# Patient Record
Sex: Female | Born: 1965 | Race: White | Hispanic: No | Marital: Married | Smoking: Current every day smoker
Health system: Southern US, Community
[De-identification: ages and names within clinical notes are randomized; demographics above are authoritative.]

## PROBLEM LIST (undated history)

## (undated) DIAGNOSIS — M797 Fibromyalgia: Secondary | ICD-10-CM

## (undated) DIAGNOSIS — F329 Major depressive disorder, single episode, unspecified: Secondary | ICD-10-CM

## (undated) DIAGNOSIS — F32A Depression, unspecified: Secondary | ICD-10-CM

## (undated) DIAGNOSIS — IMO0002 Reserved for concepts with insufficient information to code with codable children: Secondary | ICD-10-CM

## (undated) HISTORY — PX: TUBAL LIGATION: SHX77

## (undated) HISTORY — PX: OTHER SURGICAL HISTORY: SHX169

## (undated) HISTORY — PX: CATARACT EXTRACTION, BILATERAL: SHX1313

## (undated) HISTORY — PX: TRIGGER FINGER RELEASE: SHX641

## (undated) HISTORY — PX: CHOLECYSTECTOMY: SHX55

---

## 2004-12-11 ENCOUNTER — Emergency Department (HOSPITAL_COMMUNITY): Admission: EM | Admit: 2004-12-11 | Discharge: 2004-12-11 | Payer: Self-pay | Admitting: Emergency Medicine

## 2005-07-05 IMAGING — CT CT HEAD W/O CM
2 of 3 series · 14 of 33 positions shown, 18 images · IV contrast (agent unspecified)
Comparison: none

CLINICAL DATA: Box fell on top of head.
 CT HEAD WITHOUT CONTRAST:
 There are no midline shifts or mass effects and the ventricles are normal in size and contour.  There is no evidence for intracerebral hemorrhage or contusion and there are no extra-axial fluid collections.  Seen is an air-fluid level within the left maxillary sinus.  Bone window settings are otherwise normal.

[Series 2: — · sagittal · 0.59mm/px · 1 of 3 slices shown (1 of 2)]
[im 2/3  brain]
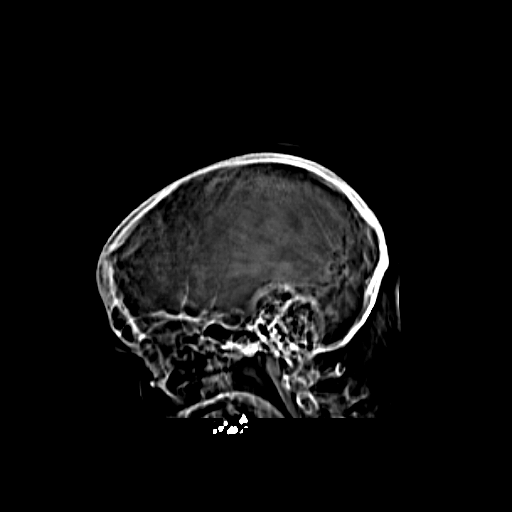

[Series 3: — · axial · 0.43mm/px · z∈[+1202,+1312]mm · 13 of 26 slices shown, 17 images (2 of 2)]
[im 2/26  brain]
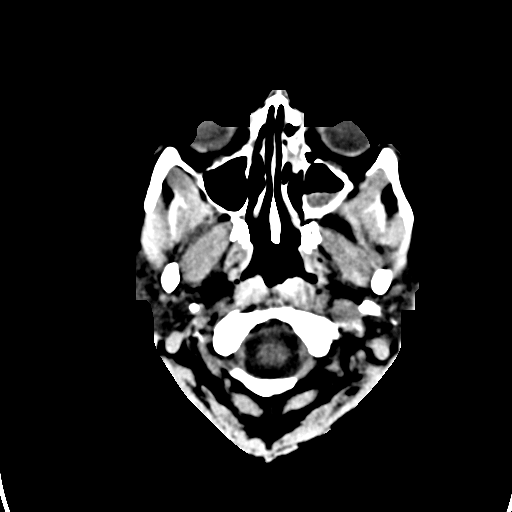
[im 2/26  bone]
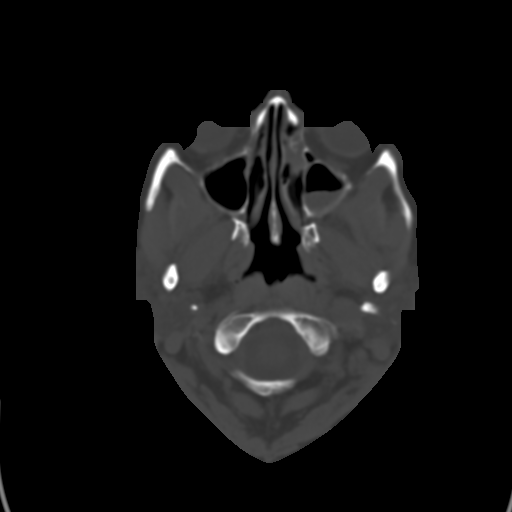
[im 4/26  brain]
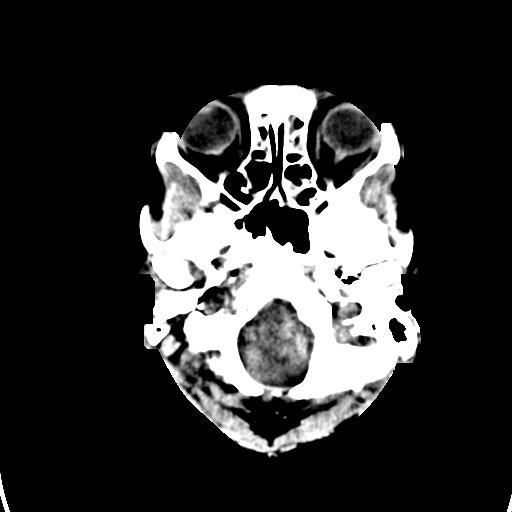
[im 6/26  brain]
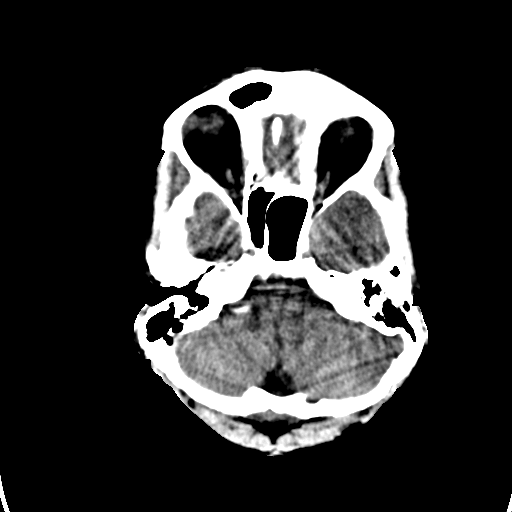
[im 8/26  brain]
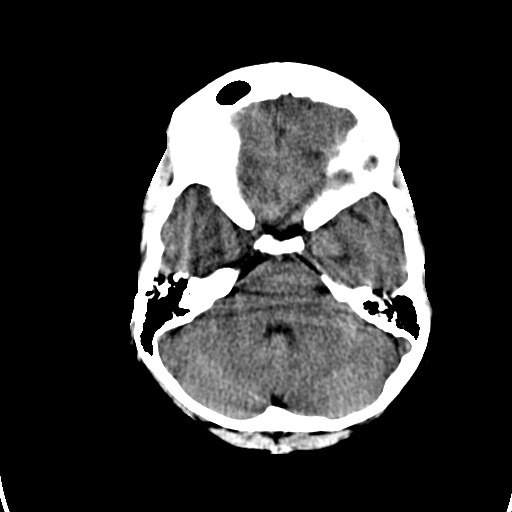
[im 9/26  brain]
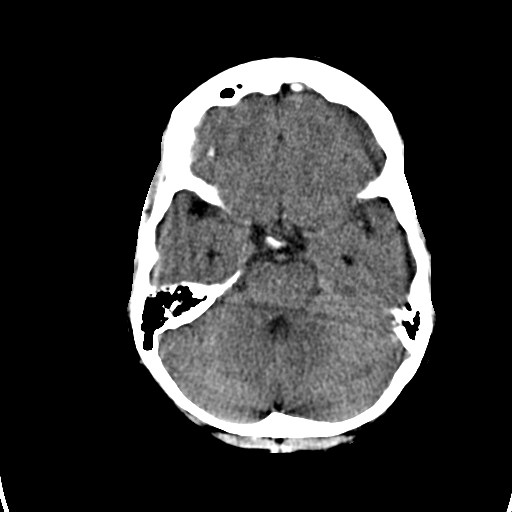
[im 9/26  bone]
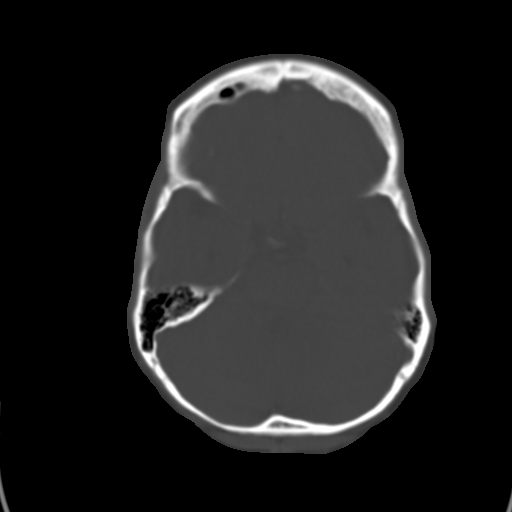
[im 11/26  brain]
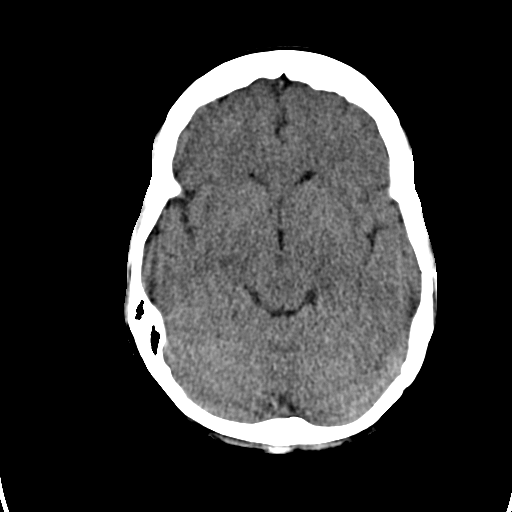
[im 13/26  brain]
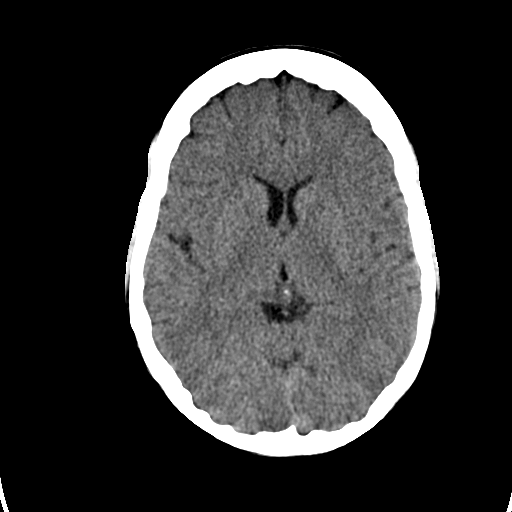
[im 15/26  brain]
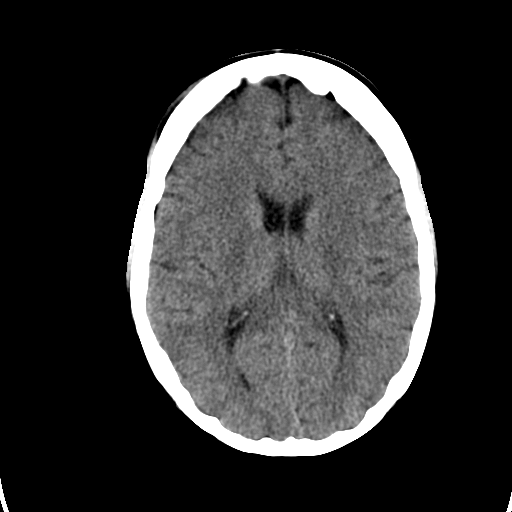
[im 17/26  brain]
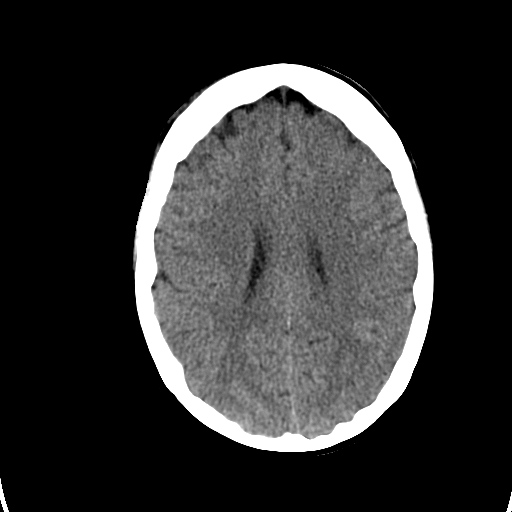
[im 17/26  bone]
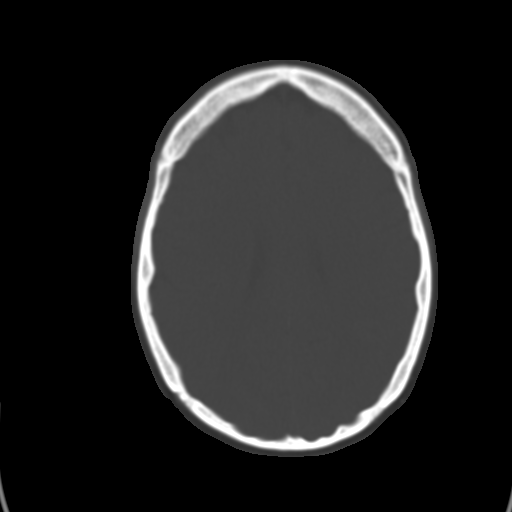
[im 18/26  brain]
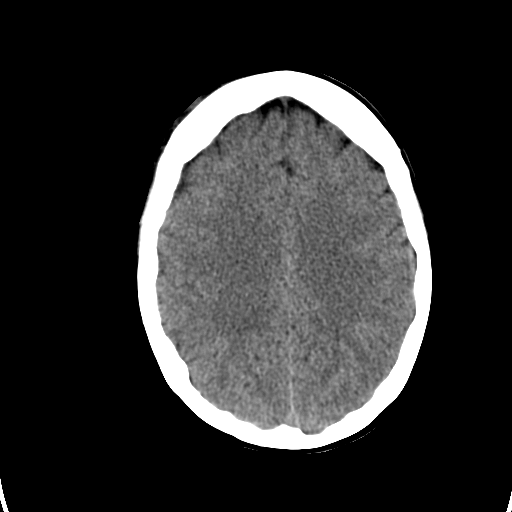
[im 20/26  brain]
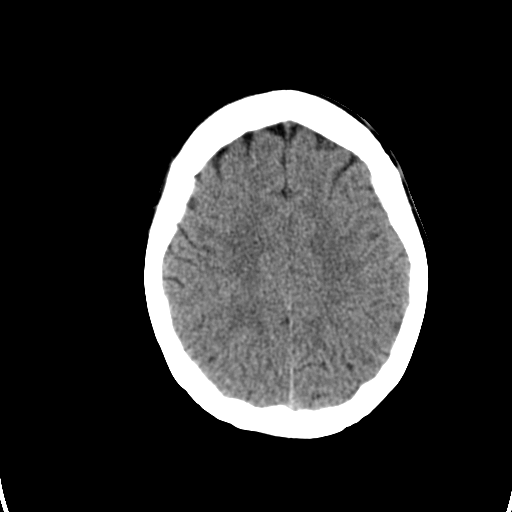
[im 22/26  brain]
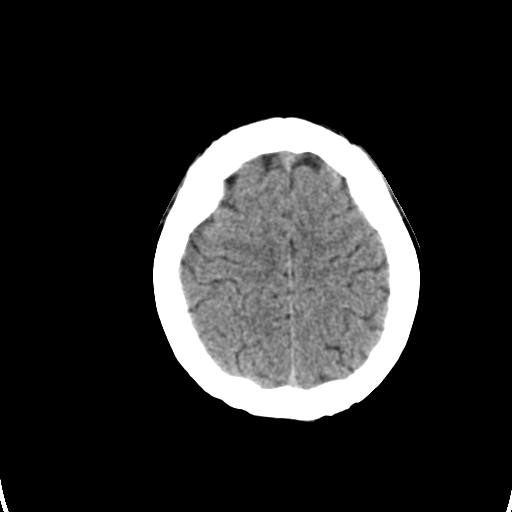
[im 24/26  brain]
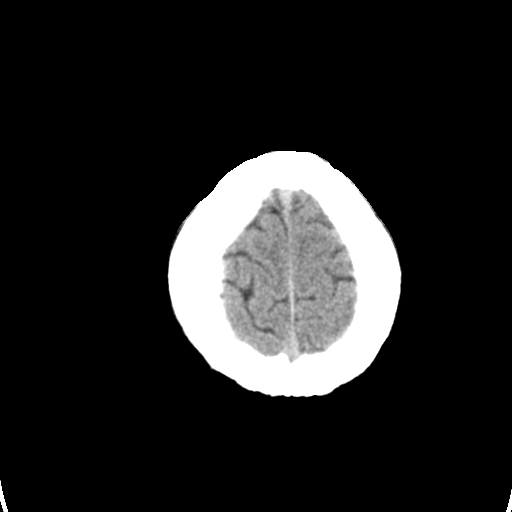
[im 24/26  bone]
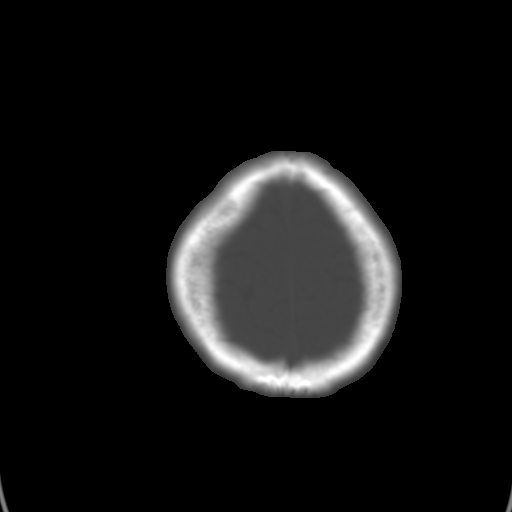

[14 of 33 positions shown; findings below may reference images not displayed]

IMPRESSION: Air-fluid level within the left maxillary sinus.  Normal intracranial study.

## 2005-07-05 IMAGING — CR DG CERVICAL SPINE COMPLETE 4+V
6 series · 6 of 6 positions shown · non-contrast
Comparison: none

CLINICAL DATA: Trauma with injury to neck and back with neck and back pain.
 SIX-VIEW CERVICAL SPINE:
 Normal cervical alignment is noted without evidence of fracture, subluxation, or prevertebral soft tissue swelling.  Disc spaces are well-maintained.

[view not recorded (1 of 6)]
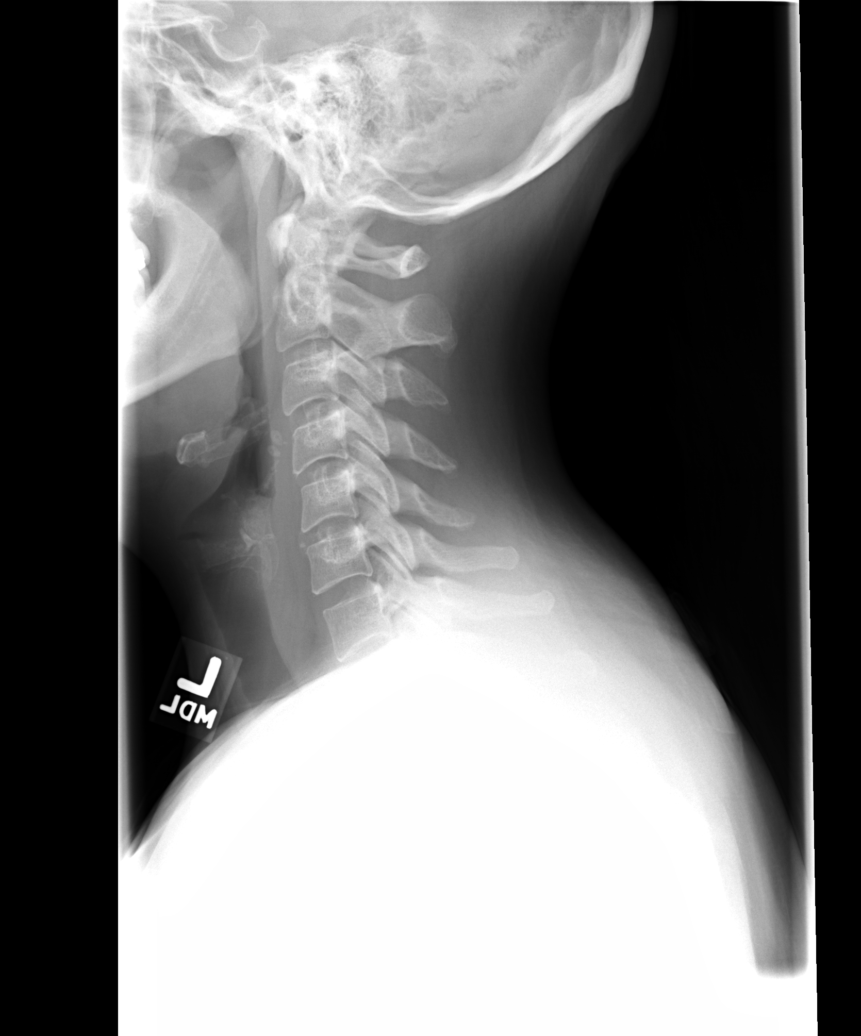

[view not recorded (2 of 6)]
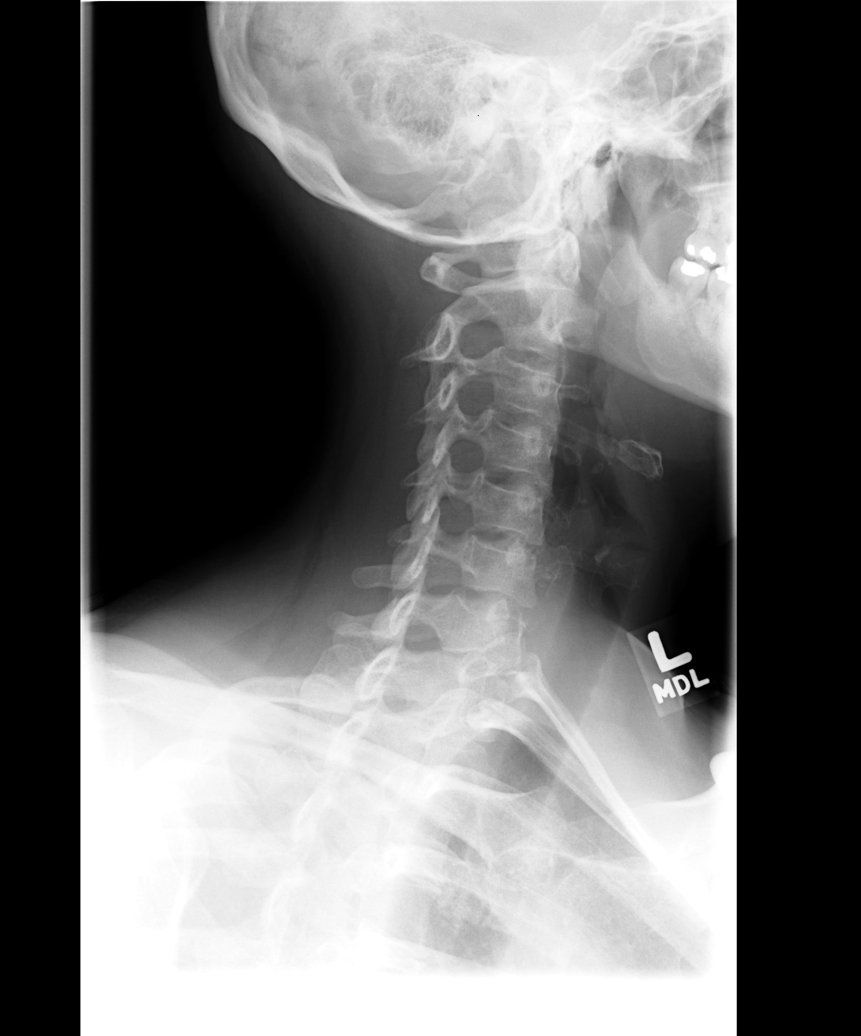

[view not recorded (3 of 6)]
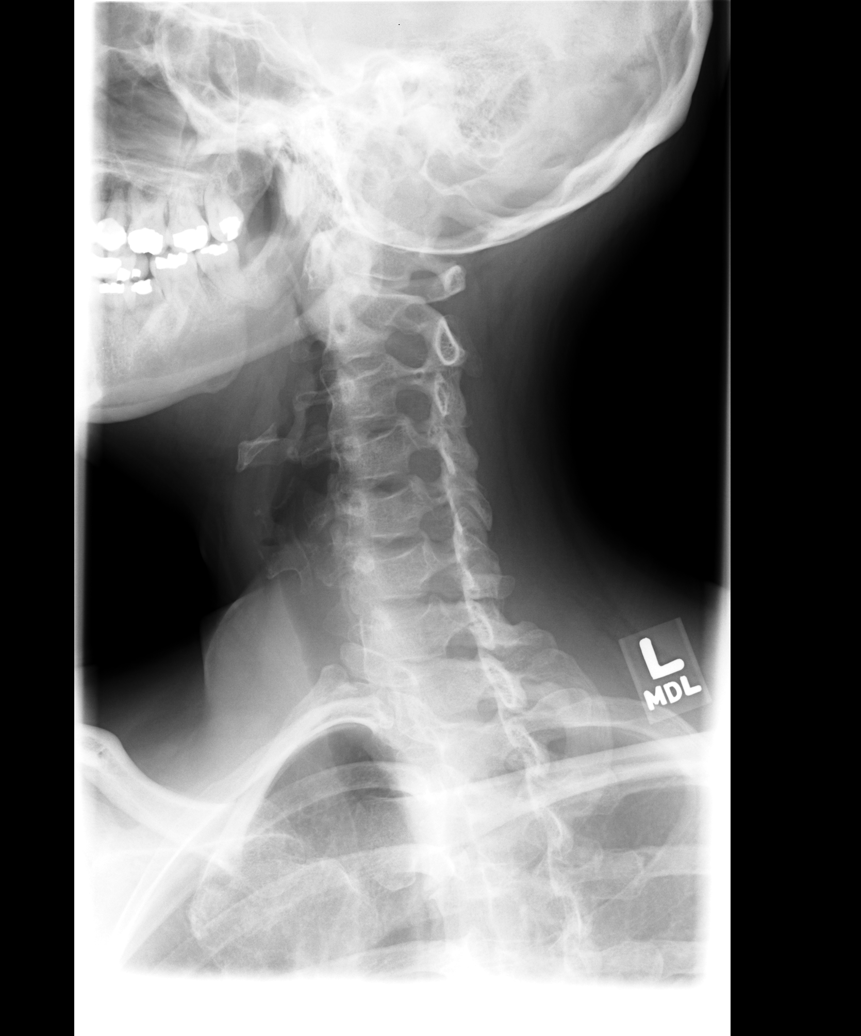

[view not recorded (4 of 6)]
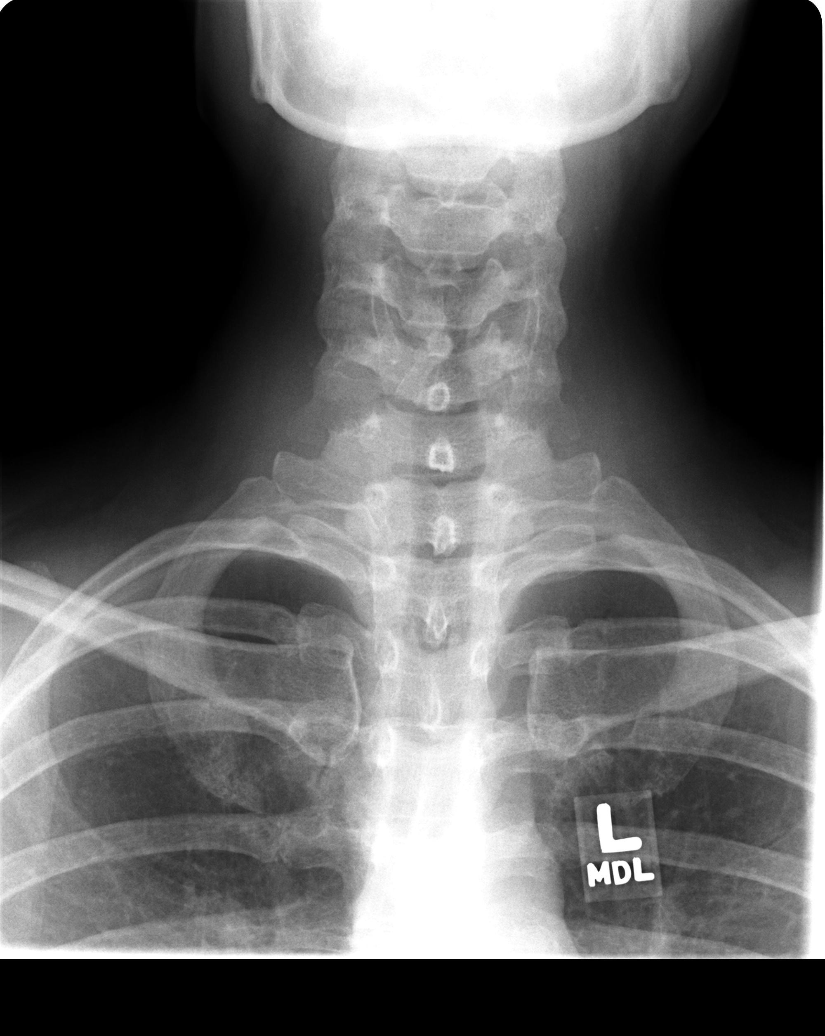

[view not recorded (5 of 6)]
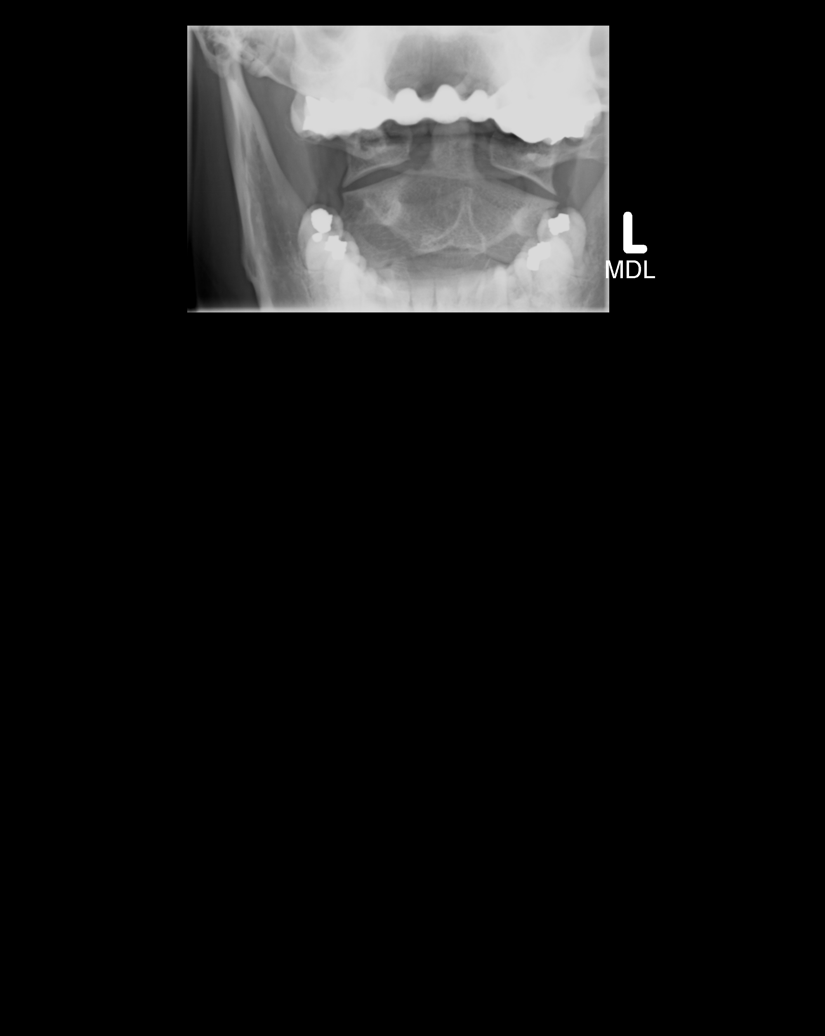

[view not recorded (6 of 6)]
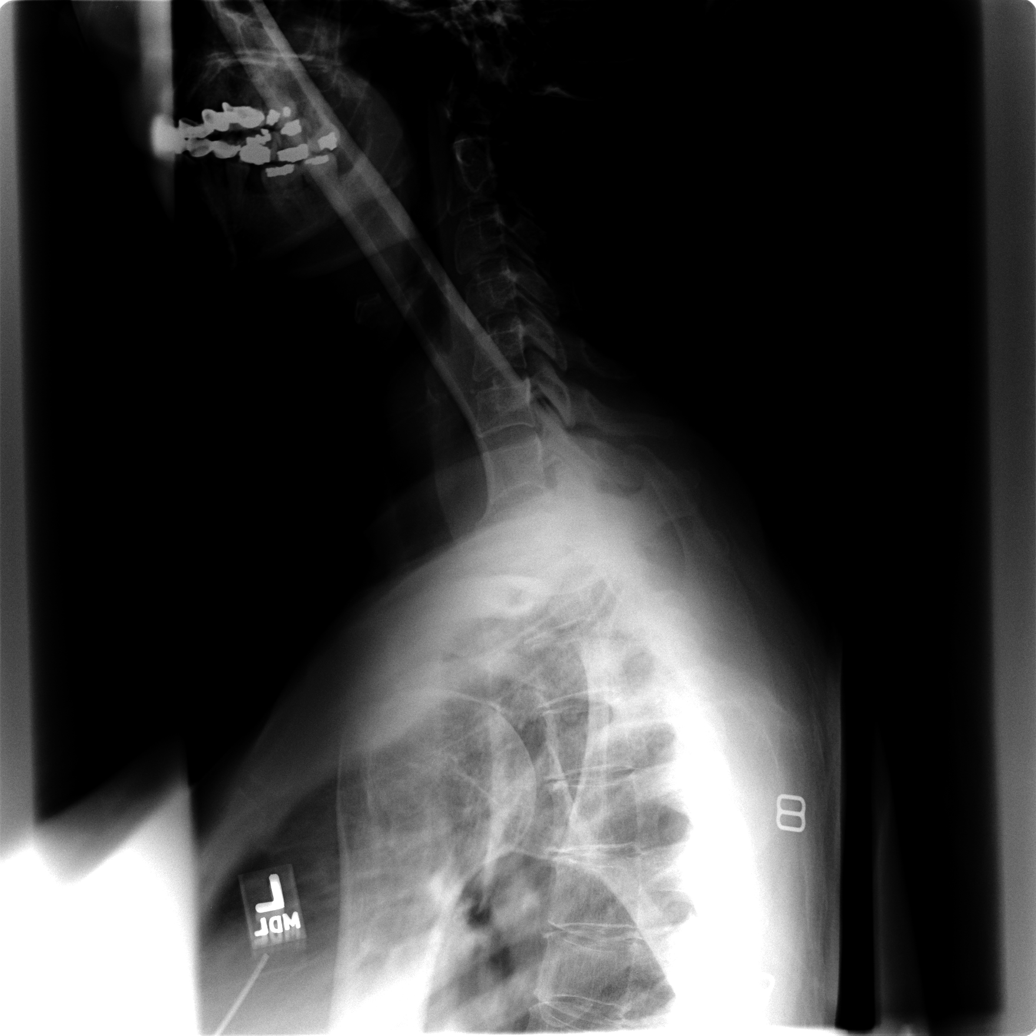

[6 of 6 positions shown; findings below may reference images not displayed]

IMPRESSION: No static evidence of acute injury to the cervical spine. 
 TWO-VIEW THORACIC SPINE:
 Normal thoracic alignment is noted without evidence of fracture or subluxation.  Mild degenerative disc disease and spondylosis are identified.
IMPRESSION: No acute abnormality.

## 2009-12-14 DIAGNOSIS — M797 Fibromyalgia: Secondary | ICD-10-CM

## 2009-12-14 HISTORY — DX: Fibromyalgia: M79.7

## 2016-12-05 ENCOUNTER — Emergency Department (HOSPITAL_BASED_OUTPATIENT_CLINIC_OR_DEPARTMENT_OTHER)
Admission: EM | Admit: 2016-12-05 | Discharge: 2016-12-05 | Disposition: A | Discharge status: Home / Self Care | Payer: BLUE CROSS/BLUE SHIELD | Attending: Emergency Medicine | Admitting: Emergency Medicine

## 2016-12-05 ENCOUNTER — Encounter (HOSPITAL_BASED_OUTPATIENT_CLINIC_OR_DEPARTMENT_OTHER): Payer: Self-pay | Admitting: *Deleted

## 2016-12-05 DIAGNOSIS — J019 Acute sinusitis, unspecified: Secondary | ICD-10-CM | POA: Insufficient documentation

## 2016-12-05 DIAGNOSIS — Z79899 Other long term (current) drug therapy: Secondary | ICD-10-CM | POA: Diagnosis not present

## 2016-12-05 DIAGNOSIS — R0981 Nasal congestion: Secondary | ICD-10-CM | POA: Diagnosis present

## 2016-12-05 DIAGNOSIS — F17210 Nicotine dependence, cigarettes, uncomplicated: Secondary | ICD-10-CM | POA: Diagnosis not present

## 2016-12-05 HISTORY — DX: Reserved for concepts with insufficient information to code with codable children: IMO0002

## 2016-12-05 HISTORY — DX: Fibromyalgia: M79.7

## 2016-12-05 HISTORY — DX: Major depressive disorder, single episode, unspecified: F32.9

## 2016-12-05 HISTORY — DX: Depression, unspecified: F32.A

## 2016-12-05 MED ORDER — LORATADINE-PSEUDOEPHEDRINE ER 5-120 MG PO TB12: 1.0000 | ORAL_TABLET | Freq: Two times a day (BID) | ORAL | 0 refills | Status: AC

## 2016-12-05 MED ORDER — DOXYCYCLINE HYCLATE 100 MG PO CAPS: 100.0000 mg | ORAL_CAPSULE | Freq: Two times a day (BID) | ORAL | 0 refills | Status: AC

## 2016-12-05 NOTE — ED Notes (Signed)
ED Provider at bedside. 

## 2016-12-05 NOTE — ED Provider Notes (Signed)
[facility]   CSN: [id number] Arrival date & time: 12/05/16  [id number]     History   Chief Complaint Chief Complaint  Patient presents with  . URI    HPI Tracy Butler is a 50 y.o. female.  HPI Patient have flulike symptoms of the end of November. Those improved but she has had constant facial pressure and nasal congestion for over 2 weeks now. Patient reports copious amounts of thick nasal discharge. Also pressure pain in left ear. No cough or sore throat. She does endorse facial pain. Patient reports she gets a pretty bad sinus infection about once a year. Past Medical History:  Diagnosis Date  . CRPS (complex regional pain syndrome)    L arm  . Depression   . Fibromyalgia 2011    There are no active problems to display for this patient.   Past Surgical History:  Procedure Laterality Date  . CATARACT EXTRACTION, BILATERAL    . CHOLECYSTECTOMY    . TRIGGER FINGER RELEASE Right   . TUBAL LIGATION    . uterine ablation      OB History    No data available       Home Medications    Prior to Admission medications   Medication Sig Start Date End Date Taking? Authorizing Provider  BuPROPion HCl (WELLBUTRIN PO) Take by mouth.   Yes Historical Provider, MD  CARBAMAZEPINE PO Take by mouth.   Yes Historical Provider, MD  CLONAZEPAM PO Take by mouth.   Yes Historical Provider, MD  DULoxetine (CYMBALTA) 60 MG capsule Take 60 mg by mouth daily.   Yes Historical Provider, MD  OMEPRAZOLE PO Take by mouth.   Yes Historical Provider, MD  perphenazine (TRILAFON) 4 MG tablet Take 4 mg by mouth daily.   Yes Historical Provider, MD  doxycycline (VIBRAMYCIN) 100 MG capsule Take 1 capsule (100 mg total) by mouth 2 (two) times daily. One po bid x 7 days 12/05/16   Arby BarretteMarcy Siegfried Vieth, MD  loratadine-pseudoephedrine (CLARITIN-D 12 HOUR) 5-120 MG tablet Take 1 tablet by mouth 2 (two) times daily. 12/05/16   Arby BarretteMarcy Shimeka Bacot, MD    Family History No family history on  file.  Social History Social History  Substance Use Topics  . Smoking status: Current Every Day Smoker    Types: Cigarettes  . Smokeless tobacco: Never Used  . Alcohol use Yes     Comment: occ monthly     Allergies   Codeine; Penicillins; and Sulfa antibiotics   Review of Systems Review of Systems Constitutional: No fever no chills positive for malaise Respiratory: Occasional cough, no shortness of breath, no chest pain  Physical Exam Updated Vital Signs BP 136/91 (BP Location: Right Arm)   Pulse 81   Temp 98.6 F (37 C) (Oral)   Resp 20   Ht 5\' 1"  (1.549 m)   Wt 165 lb (74.8 kg)   SpO2 97%   BMI 31.18 kg/m   Physical Exam  Constitutional: She is oriented to person, place, and time. She appears well-developed and well-nourished. No distress.  HENT:  Facial tenderness to percussion over frontal sinuses. Left TM mild fullness with slight erythema no purulent effusion. Right TM normal. Oral cavity widely patent. No posterior erythema or exudate.  Eyes: EOM are normal. Pupils are equal, round, and reactive to light. Right eye exhibits no discharge. Left eye exhibits no discharge. No scleral icterus.  Neck: Normal range of motion. Neck supple.  Cardiovascular: Normal rate, regular rhythm and normal  heart sounds.   Pulmonary/Chest: Effort normal and breath sounds normal.  Musculoskeletal: Normal range of motion.  Lymphadenopathy:    She has no cervical adenopathy.  Neurological: She is alert and oriented to person, place, and time. No cranial nerve deficit. Coordination normal.  Skin: Skin is warm and dry.  Psychiatric: She has a normal mood and affect.     ED Treatments / Results  Labs (all labs ordered are listed, but only abnormal results are displayed) Labs Reviewed - No data to display  EKG  EKG Interpretation None       Radiology No results found.  Procedures Procedures (including critical care time)  Medications Ordered in ED Medications - No  data to display   Initial Impression / Assessment and Plan / ED Course  I have reviewed the triage vital signs and the nursing notes.  Pertinent labs & imaging results that were available during my care of the patient were reviewed by me and considered in my medical decision making (see chart for details).  Clinical Course      Final Clinical Impressions(s) / ED Diagnoses   Final diagnoses:  Acute non-recurrent sinusitis, unspecified location   Patient has had 2 weeks of sinus congestion and pressure. Positive facial tenderness to percussion. Patient be treated for sinusitis. She reports penicillin allergy. Doxycycline prescribed. New Prescriptions New Prescriptions   DOXYCYCLINE (VIBRAMYCIN) 100 MG CAPSULE    Take 1 capsule (100 mg total) by mouth 2 (two) times daily. One po bid x 7 days   LORATADINE-PSEUDOEPHEDRINE (CLARITIN-D 12 HOUR) 5-120 MG TABLET    Take 1 tablet by mouth 2 (two) times daily.     Arby BarretteMarcy Lien Lyman, MD 12/05/16 [phone]

## 2016-12-05 NOTE — ED Triage Notes (Signed)
Pt reports nasal congestion, sinus pressure, productive cough. Denies fever (reports chills and sweats), n/v/d.

## 2017-03-26 ENCOUNTER — Encounter (HOSPITAL_BASED_OUTPATIENT_CLINIC_OR_DEPARTMENT_OTHER): Payer: Self-pay | Admitting: Emergency Medicine

## 2017-03-26 ENCOUNTER — Emergency Department (HOSPITAL_BASED_OUTPATIENT_CLINIC_OR_DEPARTMENT_OTHER)
Admission: EM | Admit: 2017-03-26 | Discharge: 2017-03-26 | Disposition: A | Discharge status: Home / Self Care | Payer: BLUE CROSS/BLUE SHIELD | Attending: Emergency Medicine | Admitting: Emergency Medicine

## 2017-03-26 DIAGNOSIS — Y9389 Activity, other specified: Secondary | ICD-10-CM | POA: Diagnosis not present

## 2017-03-26 DIAGNOSIS — Z23 Encounter for immunization: Secondary | ICD-10-CM | POA: Diagnosis not present

## 2017-03-26 DIAGNOSIS — S61211A Laceration without foreign body of left index finger without damage to nail, initial encounter: Secondary | ICD-10-CM | POA: Insufficient documentation

## 2017-03-26 DIAGNOSIS — Y92009 Unspecified place in unspecified non-institutional (private) residence as the place of occurrence of the external cause: Secondary | ICD-10-CM | POA: Insufficient documentation

## 2017-03-26 DIAGNOSIS — Y999 Unspecified external cause status: Secondary | ICD-10-CM | POA: Diagnosis not present

## 2017-03-26 DIAGNOSIS — W268XXA Contact with other sharp object(s), not elsewhere classified, initial encounter: Secondary | ICD-10-CM | POA: Insufficient documentation

## 2017-03-26 DIAGNOSIS — F17210 Nicotine dependence, cigarettes, uncomplicated: Secondary | ICD-10-CM | POA: Insufficient documentation

## 2017-03-26 DIAGNOSIS — S6992XA Unspecified injury of left wrist, hand and finger(s), initial encounter: Secondary | ICD-10-CM | POA: Diagnosis present

## 2017-03-26 MED ORDER — TETANUS-DIPHTH-ACELL PERTUSSIS 5-2.5-18.5 LF-MCG/0.5 IM SUSP
0.5000 mL | Freq: Once | INTRAMUSCULAR | Status: AC
  Administered 2017-03-26: 0.5 mL via INTRAMUSCULAR
  Filled 2017-03-26: qty 0.5

## 2017-03-26 MED ORDER — LIDOCAINE HCL (PF) 1 % IJ SOLN
10.0000 mL | Freq: Once | INTRAMUSCULAR | Status: DC
  Filled 2017-03-26: qty 10

## 2017-03-26 NOTE — ED Triage Notes (Signed)
Patient reports that she was trimming her bushes at home and cut her left 1st finger with hedge clippers

## 2017-03-30 NOTE — ED Provider Notes (Signed)
[facility]   CSN: [id number] Arrival date & time: 03/26/17  1138     History   Chief Complaint Chief Complaint  Patient presents with  . Finger Injury    HPI Tracy Butler is a 52 y.o. female.  HPI  Patient was using hedge cutters just prior to arrival to ED when she cut her left index finger.  Had significant bleeding at the scene which is now controlled.  Was unsure of last tetanus but had updated now in ED.  Pain severe.  Does not believe anything broken. No falls or other trauma.   Past Medical History:  Diagnosis Date  . CRPS (complex regional pain syndrome)    L arm  . Depression   . Fibromyalgia 2011    There are no active problems to display for this patient.   Past Surgical History:  Procedure Laterality Date  . CATARACT EXTRACTION, BILATERAL    . CHOLECYSTECTOMY    . TRIGGER FINGER RELEASE Right   . TUBAL LIGATION    . uterine ablation      OB History    No data available       Home Medications    Prior to Admission medications   Medication Sig Start Date End Date Taking? Authorizing Provider  BuPROPion HCl (WELLBUTRIN PO) Take by mouth.    Historical Provider, MD  CARBAMAZEPINE PO Take by mouth.    Historical Provider, MD  CLONAZEPAM PO Take by mouth.    Historical Provider, MD  doxycycline (VIBRAMYCIN) 100 MG capsule Take 1 capsule (100 mg total) by mouth 2 (two) times daily. One po bid x 7 days 12/05/16   Arby Barrette, MD  DULoxetine (CYMBALTA) 60 MG capsule Take 60 mg by mouth daily.    Historical Provider, MD  loratadine-pseudoephedrine (CLARITIN-D 12 HOUR) 5-120 MG tablet Take 1 tablet by mouth 2 (two) times daily. 12/05/16   Arby Barrette, MD  OMEPRAZOLE PO Take by mouth.    Historical Provider, MD  perphenazine (TRILAFON) 4 MG tablet Take 4 mg by mouth daily.    Historical Provider, MD    Family History History reviewed. No pertinent family history.  Social History Social History  Substance Use Topics    . Smoking status: Current Every Day Smoker    Types: Cigarettes  . Smokeless tobacco: Never Used  . Alcohol use Yes     Comment: occ monthly     Allergies   Codeine; Penicillins; and Sulfa antibiotics   Review of Systems Review of Systems  Constitutional: Negative for fever.  Respiratory: Negative for cough.   Gastrointestinal: Negative for nausea and vomiting.  Musculoskeletal: Negative for arthralgias.  Skin: Positive for wound.  Neurological: Negative for headaches.     Physical Exam Updated Vital Signs BP (!) 141/89 (BP Location: Right Arm)   Pulse 70   Temp 98.3 F (36.8 C) (Oral)   Resp 18   Ht  (1.575 m)   Wt 150 lb (68 kg)   SpO2 97%   BMI 27.44 kg/m   Physical Exam  Constitutional: She is oriented to person, place, and time. She appears well-developed and well-nourished. No distress.  HENT:  Head: Normocephalic and atraumatic.  Eyes: Conjunctivae and EOM are normal.  Neck: Normal range of motion.  Cardiovascular: Normal rate, regular rhythm, normal heart sounds and intact distal pulses.  Exam reveals no gallop and no friction rub.   No murmur heard. Pulmonary/Chest: Effort normal and breath sounds normal. No respiratory distress.  She has no wheezes. She has no rales.  Musculoskeletal: She exhibits no edema or tenderness.  Neurological: She is alert and oriented to person, place, and time.  Skin: Skin is warm and dry. Laceration (star shaped laceration left index finger between PIP and DIP palmar side) noted. No rash noted. She is not diaphoretic. No erythema.  Nursing note and vitals reviewed.    ED Treatments / Results  Labs (all labs ordered are listed, but only abnormal results are displayed) Labs Reviewed - No data to display  EKG  EKG Interpretation None       Radiology No results found.  Procedures .Marland KitchenLaceration Repair Date/Time: 03/30/2017 4:30 PM Performed by: Alvira Monday Authorized by: Alvira Monday   Consent:     Consent obtained:  Verbal   Consent given by:  Patient   Risks discussed:  Pain, poor cosmetic result, poor wound healing, need for additional repair, infection and vascular damage   Alternatives discussed:  Observation Anesthesia (see MAR for exact dosages):    Anesthesia method:  Local infiltration and nerve block   Local anesthetic:  Lidocaine 1% w/o epi   Block needle gauge:  25 G   Block anesthetic:  Lidocaine 1% w/o epi   Block technique:  Digital   Block injection procedure:  Anatomic landmarks identified, introduced needle and negative aspiration for blood   Block outcome:  Incomplete block Laceration details:    Location:  Finger   Finger location:  L index finger   Length (cm):  1 Repair type:    Repair type:  Simple Pre-procedure details:    Preparation:  Patient was prepped and draped in usual sterile fashion Exploration:    Hemostasis achieved with:  Tourniquet and direct pressure   Wound exploration: entire depth of wound probed and visualized     Wound extent: no foreign bodies/material noted     Contaminated: no   Treatment:    Area cleansed with:  Saline   Amount of cleaning:  Extensive   Irrigation solution:  Sterile saline   Irrigation volume:  300   Irrigation method:  Syringe Skin repair:    Repair method:  Sutures   Suture size:  4-0   Suture material:  Prolene   Suture technique:  Simple interrupted   Number of sutures:  4 Approximation:    Approximation:  Close Post-procedure details:    Dressing:  Antibiotic ointment and splint for protection   Patient tolerance of procedure:  Tolerated well, no immediate complications   (including critical care time)  Medications Ordered in ED Medications  Tdap (BOOSTRIX) injection 0.5 mL (0.5 mLs Intramuscular Given 03/26/17 1237)     Initial Impression / Assessment and Plan / ED Course  I have reviewed the triage vital signs and the nursing notes.  Pertinent labs & imaging results that were available  during my care of the patient were reviewed by me and considered in my medical decision making (see chart for details).     51yo female presents with left index finger laceration while trimming hedges.   Doubt fracture by hx, mechanism, exam, no boney tenderness.  Tetanus updated.  Wound irrigated and repaired using prolene sutures. Recommend removal in 10 days. Patient discharged in stable condition with understanding of reasons to return.   Final Clinical Impressions(s) / ED Diagnoses   Final diagnoses:  Laceration of left index finger without foreign body without damage to nail, initial encounter    New Prescriptions Discharge Medication List as of 03/26/2017  2:41 PM       Alvira Monday, MD 03/30/17 1640
